# Patient Record
Sex: Male | Born: 1965 | Race: White | Hispanic: No | Smoking: Current every day smoker
Health system: Southern US, Community
[De-identification: ages and names within clinical notes are randomized; demographics above are authoritative.]

## PROBLEM LIST (undated history)

## (undated) HISTORY — PX: KNEE SURGERY: SHX244

## (undated) HISTORY — PX: TONSILLECTOMY: SUR1361

---

## 2019-05-11 ENCOUNTER — Other Ambulatory Visit: Payer: Self-pay

## 2019-05-11 ENCOUNTER — Encounter (HOSPITAL_BASED_OUTPATIENT_CLINIC_OR_DEPARTMENT_OTHER): Payer: Self-pay | Admitting: *Deleted

## 2019-05-11 ENCOUNTER — Emergency Department (HOSPITAL_BASED_OUTPATIENT_CLINIC_OR_DEPARTMENT_OTHER)
Admission: EM | Admit: 2019-05-11 | Discharge: 2019-05-11 | Disposition: A | Discharge status: Home / Self Care | Payer: BLUE CROSS/BLUE SHIELD | Attending: Emergency Medicine | Admitting: Emergency Medicine

## 2019-05-11 ENCOUNTER — Emergency Department (HOSPITAL_BASED_OUTPATIENT_CLINIC_OR_DEPARTMENT_OTHER): Payer: BLUE CROSS/BLUE SHIELD

## 2019-05-11 DIAGNOSIS — Z23 Encounter for immunization: Secondary | ICD-10-CM | POA: Insufficient documentation

## 2019-05-11 DIAGNOSIS — W25XXXA Contact with sharp glass, initial encounter: Secondary | ICD-10-CM | POA: Insufficient documentation

## 2019-05-11 DIAGNOSIS — Y929 Unspecified place or not applicable: Secondary | ICD-10-CM | POA: Insufficient documentation

## 2019-05-11 DIAGNOSIS — Y9389 Activity, other specified: Secondary | ICD-10-CM | POA: Insufficient documentation

## 2019-05-11 DIAGNOSIS — Z79899 Other long term (current) drug therapy: Secondary | ICD-10-CM | POA: Insufficient documentation

## 2019-05-11 DIAGNOSIS — Y998 Other external cause status: Secondary | ICD-10-CM | POA: Diagnosis not present

## 2019-05-11 DIAGNOSIS — S61217A Laceration without foreign body of left little finger without damage to nail, initial encounter: Secondary | ICD-10-CM | POA: Insufficient documentation

## 2019-05-11 DIAGNOSIS — F17200 Nicotine dependence, unspecified, uncomplicated: Secondary | ICD-10-CM | POA: Diagnosis not present

## 2019-05-11 DIAGNOSIS — S6992XA Unspecified injury of left wrist, hand and finger(s), initial encounter: Secondary | ICD-10-CM | POA: Diagnosis present

## 2019-05-11 MED ORDER — ACETAMINOPHEN 325 MG PO TABS
650.0000 mg | ORAL_TABLET | Freq: Once | ORAL | Status: AC
  Administered 2019-05-11: 650 mg via ORAL
  Filled 2019-05-11: qty 2

## 2019-05-11 MED ORDER — TETANUS-DIPHTH-ACELL PERTUSSIS 5-2.5-18.5 LF-MCG/0.5 IM SUSP
0.5000 mL | Freq: Once | INTRAMUSCULAR | Status: AC
  Administered 2019-05-11: 0.5 mL via INTRAMUSCULAR
  Filled 2019-05-11: qty 0.5

## 2019-05-11 MED ORDER — ACETAMINOPHEN 325 MG PO TABS
ORAL_TABLET | ORAL | Status: AC
  Filled 2019-05-11: qty 1

## 2019-05-11 MED ORDER — LIDOCAINE HCL (PF) 1 % IJ SOLN
5.0000 mL | Freq: Once | INTRAMUSCULAR | Status: AC
  Administered 2019-05-11: 5 mL
  Filled 2019-05-11: qty 5

## 2019-05-11 MED ORDER — OXYCODONE HCL 5 MG PO TABS
5.0000 mg | ORAL_TABLET | Freq: Once | ORAL | Status: AC
  Administered 2019-05-11: 5 mg via ORAL
  Filled 2019-05-11: qty 1

## 2019-05-11 MED ORDER — DOXYCYCLINE HYCLATE 100 MG PO CAPS: 100.0000 mg | ORAL_CAPSULE | Freq: Two times a day (BID) | ORAL | 0 refills | Status: AC

## 2019-05-11 NOTE — ED Notes (Signed)
Pt. Finger has been numbed

## 2019-05-11 NOTE — ED Provider Notes (Signed)
[facility] EMERGENCY DEPARTMENT Provider Note   CSN: [id number] Arrival date & time: 05/11/19  1359     History Chief Complaint  Patient presents with  . Skin Avulsion    William Bolton is a 54 y.o. male otherwise healthy no daily medication use patient carrying a mirror shortly before arrival when it broke lacerating his left fifth finger, patient immediately controlled bleeding with direct pressure.  Describes a moderate intensity sharp pain constant nonradiating worsened with palpation improved with rest and time.  He denies any other concerns today, no headache, nausea/vomiting, numbness/tingling, weakness, or additional injuries. HPI     History reviewed. No pertinent past medical history.  There are no problems to display for this patient.   Past Surgical History:  Procedure Laterality Date  . KNEE SURGERY    . TONSILLECTOMY         No family history on file.  Social History   Tobacco Use  . Smoking status: Current Every Day Smoker  . Smokeless tobacco: Never Used  Substance Use Topics  . Alcohol use: Yes  . Drug use: Never    Home Medications Prior to Admission medications   Medication Sig Start Date End Date Taking? Authorizing Provider  omeprazole (PRILOSEC) 40 MG capsule Take by mouth. 12/20/17 01/31/20 Yes [provider]  doxycycline (VIBRAMYCIN) 100 MG capsule Take 1 capsule (100 mg total) by mouth 2 (two) times daily for 7 days. 05/11/19 05/18/19  Harlene Salts A, PA-C    Allergies    Amaranth (fd&c red #2), Iodine, Iodine-131, Codeine, and Hydrocodone  Review of Systems   Review of Systems  Constitutional: Negative.  Negative for chills and fever.  Gastrointestinal: Negative.  Negative for nausea and vomiting.  Skin: Positive for wound. Negative for color change.  Neurological: Negative.  Negative for weakness and numbness.    Physical Exam Updated Vital Signs BP (!) 152/97 (BP Location: Right Arm)   Pulse 69   Temp  98.1 F (36.7 C) (Oral)   Resp 16   Ht 6\' 2"  (1.88 m)   Wt 78.9 kg   SpO2 99%   BMI 22.34 kg/m   Physical Exam Constitutional:      General: He is not in acute distress.    Appearance: Normal appearance. He is well-developed. He is not ill-appearing or diaphoretic.  HENT:     Head: Normocephalic and atraumatic.     Right Ear: External ear normal.     Left Ear: External ear normal.     Nose: Nose normal.  Eyes:     General: Vision grossly intact. Gaze aligned appropriately.     Pupils: Pupils are equal, round, and reactive to light.  Neck:     Trachea: Trachea and phonation normal. No tracheal deviation.  Pulmonary:     Effort: Pulmonary effort is normal. No respiratory distress.  Abdominal:     General: There is no distension.     Palpations: Abdomen is soft.     Tenderness: There is no abdominal tenderness. There is no guarding or rebound.  Musculoskeletal:        General: Normal range of motion.       Hands:     Cervical back: Normal range of motion.     Comments: Left hand: 1 cm laceration extending from the ulnar side just below the nailbed distally to the tip of the finger.  No gross deformities.  Fingernail appears intact.  Otherwise fingers appear normal.  Tenderness directly over laceration.  No other pain. No snuffbox tenderness to palpation. No tenderness to palpation over flexor sheath. Finger adduction/abduction intact with 5/5 strength.  Thumb opposition intact. Full active and resisted ROM to flexion/extension at wrist, MCP, PIP and DIP of all fingers.  FDS/FDP intact. Grip 5/5 strength. Radial artery 2+ with <2sec cap refill in all fingers. Sensation intact to light-tough in median/ulnar/radial distributions. - See picture below, picture was taken before cleaning.  After the wound was thoroughly cleaned the small scab which is present at the distal tip along with the small amount of blood on the ulnar side of the finger was removed, these small areas were holding  the wound together revealing the laceration.  Skin:    General: Skin is warm and dry.  Neurological:     Mental Status: He is alert.     GCS: GCS eye subscore is 4. GCS verbal subscore is 5. GCS motor subscore is 6.     Comments: Speech is clear and goal oriented, follows commands Major Cranial nerves without deficit, no facial droop Moves extremities without ataxia, coordination intact  Psychiatric:        Behavior: Behavior normal.         ED Results / Procedures / Treatments   Labs (all labs ordered are listed, but only abnormal results are displayed) Labs Reviewed - No data to display  EKG None  Radiology DG Finger Little Left  Result Date: 05/11/2019 CLINICAL DATA:  Laceration to left little finger on broken [place]. EXAM: LEFT LITTLE FINGER 2+V COMPARISON:  None. FINDINGS: There is no evidence of fracture or dislocation. There is no evidence of arthropathy or other focal bone abnormality. The site of laceration is not well delineated radiographically. No radiopaque foreign body. IMPRESSION: No acute osseous abnormality. No radiopaque foreign body. Site of laceration is not well delineated radiographically. Electronically Signed   By: Keith Rake M.D.   On: 05/11/2019 15:38    Procedures .Marland KitchenLaceration Repair  Date/Time: 05/11/2019 4:05 PM Performed by: Deliah Boston, PA-C Authorized by: Deliah Boston, PA-C   Consent:    Consent obtained:  Verbal   Consent given by:  Patient   Risks discussed:  Infection, need for additional repair, nerve damage, poor wound healing, poor cosmetic result, pain, retained foreign body, tendon damage and vascular damage Anesthesia (see MAR for exact dosages):    Anesthesia method:  Nerve block   Block location:  Based on left   Block needle gauge:  27 G   Block anesthetic:  Lidocaine 1% w/o epi   Block technique:  Additional   Block injection procedure:  Anatomic landmarks identified, introduced needle, negative aspiration  for blood, incremental injection and anatomic landmarks palpated   Block outcome:  Anesthesia achieved Laceration details:    Location:  Finger   Finger location:  L small finger   Length (cm):  1   Depth (mm):  3 Repair type:    Repair type:  Simple Pre-procedure details:    Preparation:  Patient was prepped and draped in usual sterile fashion and imaging obtained to evaluate for foreign bodies Exploration:    Hemostasis achieved with:  Direct pressure   Wound exploration: wound explored through full range of motion and entire depth of wound probed and visualized     Wound extent: no foreign bodies/material noted, no muscle damage noted, no nerve damage noted, no tendon damage noted, no underlying fracture noted and no vascular damage noted   Treatment:    Area cleansed  with:  Shur-Clens, saline and soap and water   Amount of cleaning:  Standard   Irrigation solution:  Sterile saline   Irrigation method:  Pressure wash Skin repair:    Repair method:  Sutures   Suture size:  6-0   Suture material:  Prolene   Suture technique:  Simple interrupted   Number of sutures:  2 Approximation:    Approximation:  Close Post-procedure details:    Dressing:  Antibiotic ointment, non-adherent dressing and sterile dressing   Patient tolerance of procedure:  Tolerated well, no immediate complications Comments:     Dressing by nursing staff.   (including critical care time)  Medications Ordered in ED Medications  lidocaine (PF) (XYLOCAINE) 1 % injection 5 mL (5 mLs Infiltration Given 05/11/19 1504)  Tdap (BOOSTRIX) injection 0.5 mL (0.5 mLs Intramuscular Given 05/11/19 1455)  acetaminophen (TYLENOL) tablet 650 mg (650 mg Oral Given 05/11/19 1455)  oxyCODONE (Oxy IR/ROXICODONE) immediate release tablet 5 mg (5 mg Oral Given 05/11/19 1607)    ED Course  I have reviewed the triage vital signs and the nursing notes.  Pertinent labs & imaging results that were available during my care of the  patient were reviewed by me and considered in my medical decision making (see chart for details).  Clinical Course as of May 10 1629  Thu May 11, 2019  1537 Patient seen and evaluated by Dr. Renaye Rakers who agrees with workup and suture repair.   [BM]    Clinical Course User Index [BM] Elizabeth Palau   MDM Rules/Calculators/A&P                     Kristan Votta is a 54 y.o. male who presents to ED for laceration of the left fifth finger.  Laceration extends from the ulnar aspect just below the nail across to remove the distal tip.  Laceration is relatively shallow with no evidence of osseous injury, no significant vessel involvement.  He is neurovascular intact to the finger and nailbed is stable.  The laceration was thoroughly cleaned in the ED.    DG Left Little Finger: MPRESSION: No acute osseous abnormality. No radiopaque foreign body. Site of laceration is not well delineated radiographically.  I personally reviewed patient's x-ray and agree with radiologist interpretation, no evidence of foreign body or osseous injury today.  Patient's tetanus was updated. Wound thoroughly cleaned in ED today. Wound explored and bottom of wound seen in a bloodless field. Laceration repaired as dictated above.  Dr. Renaye Rakers visualized post repair, agrees with discharge at this time, advises to discharge patient with antibiotic as needed, doxy 100 mg twice daily x7 days prescribed.  Additionally patient given referral to Dr. Merrilee Seashore office as needed for follow-up.  Patient counseled on home wound care. Follow up with PCP/urgent care or return to ER for suture removal in 7 days days.  Patient was given 1 oxycodone prior to discharge, he has a ride to drive him home today.  Discussed narcotic precautions with patient and he states understanding.  At this time there does not appear to be any evidence of an acute emergency medical condition and the patient appears stable for discharge with appropriate  outpatient follow up. Diagnosis was discussed with patient who verbalizes understanding of care plan and is agreeable to discharge. I have discussed return precautions with patient who verbalizes understanding. Patient encouraged to follow-up with their PCP and Hand. All questions answered.  Patient seen and evaluated by Dr. Renaye Rakers during  this visit who agrees with discharge and outpatient follow-up.  Note: Portions of this report may have been transcribed using voice recognition software. Every effort was made to ensure accuracy; however, inadvertent computerized transcription errors may still be present. Final Clinical Impression(s) / ED Diagnoses Final diagnoses:  Laceration of left little finger without foreign body without damage to nail, initial encounter    Rx / DC Orders ED Discharge Orders         Ordered    doxycycline (VIBRAMYCIN) 100 MG capsule  2 times daily     05/11/19 1627           Elizabeth Palau 05/11/19 1633    Terald Sleeper, MD 05/11/19 1752

## 2019-05-11 NOTE — Discharge Instructions (Signed)
You have been diagnosed today with Left Fifth Finger Laceration.  At this time there does not appear to be the presence of an emergent medical condition, however there is always the potential for conditions to change. Please read and follow the below instructions.  Please return to the Emergency Department immediately for any new or worsening symptoms. Please be sure to follow up with your Primary Care Provider within one week regarding your visit today; please call their office to schedule an appointment even if you are feeling better for a follow-up visit. You are given a pain medication today called oxycodone.  This will make you drowsy.  Do not drink alcohol, drive or perform any dangerous activities for the rest of the day. Your 2 stitches will need to be removed in 7 days.  They may be removed by your primary care doctor, and urgent care, the ER or by a hand specialist. You have been prescribed an antibiotic called Keflex today, you may take this as prescribed if you feel you are developing signs of a finger infection.  Signs of infection include redness, swelling, drainage, pain, redness streaking up the finger, difficulty moving the joint, fever. Please be sure to keep the area clean.  You may rinse gently with clean soapy water twice daily and bandaged with sterile bandages.  Get help right away if: You have very bad swelling around the wound. Your pain suddenly gets worse and is very bad. You notice painful lumps near the wound or anywhere on your body. You have a red streak going away from your wound. The wound is on your hand or foot, and: You cannot move a finger or toe. Your fingers or toes look pale or bluish. You have fever or chills You have any new/concerning or worsening of symptoms  Please read the additional information packets attached to your discharge summary.  Do not take your medicine if  develop an itchy rash, swelling in your mouth or lips, or difficulty breathing;  call 911 and seek immediate emergency medical attention if this occurs.  Note: Portions of this text may have been transcribed using voice recognition software. Every effort was made to ensure accuracy; however, inadvertent computerized transcription errors may still be present.

## 2019-05-11 NOTE — ED Notes (Signed)
Pt. Reports a mirror broke at apprx. 12:40 today and sliced his pinky finger on the  L hand.  Pt. Has noted bandage on it to control bleeding.  Pt. Reports the bandage is controlling the bleeding. RN to leave bandage placed from Triage due to bleeding.

## 2019-05-11 NOTE — ED Triage Notes (Signed)
Laceration to his left little finger on a broken mirror.

## 2021-06-12 IMAGING — CR DG FINGER LITTLE 2+V*L*
3 series · 3 of 3 positions shown · non-contrast
Comparison: None.

CLINICAL DATA: Laceration to left little finger on broken [place].

EXAM:
LEFT LITTLE FINGER 2+V

[x finger pa left]
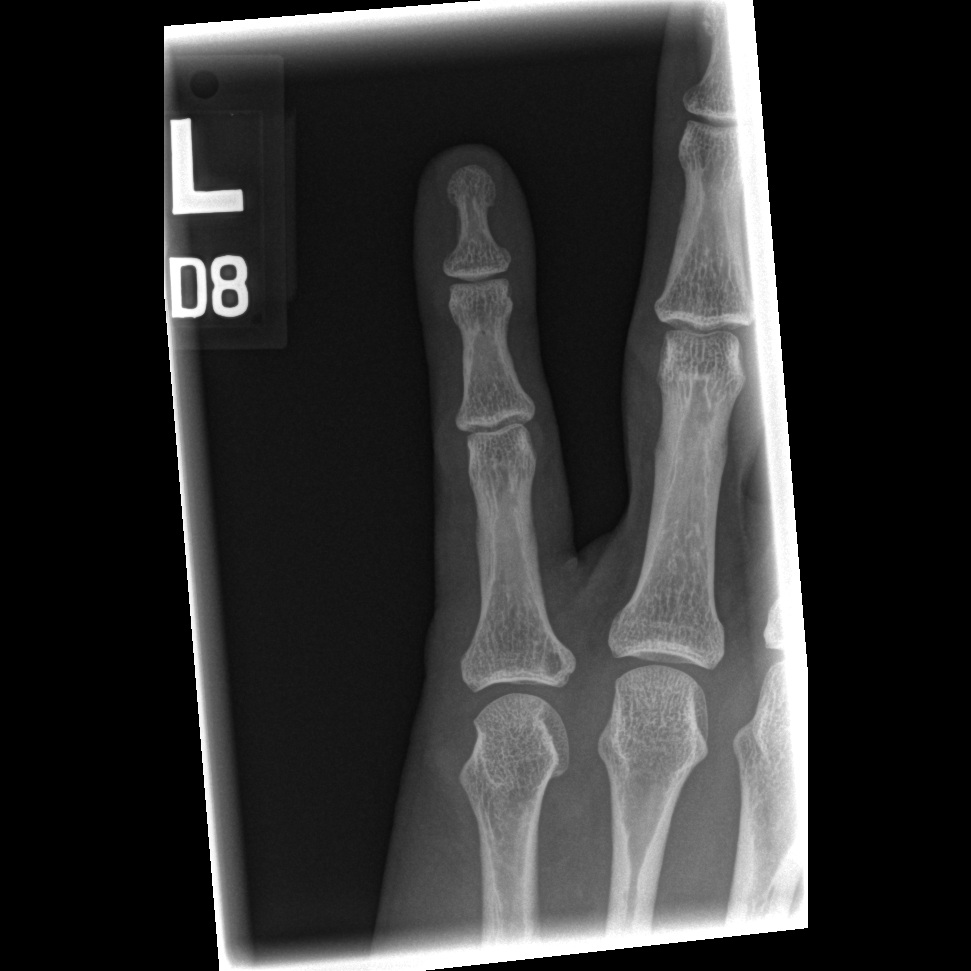

[x finger obl. left]
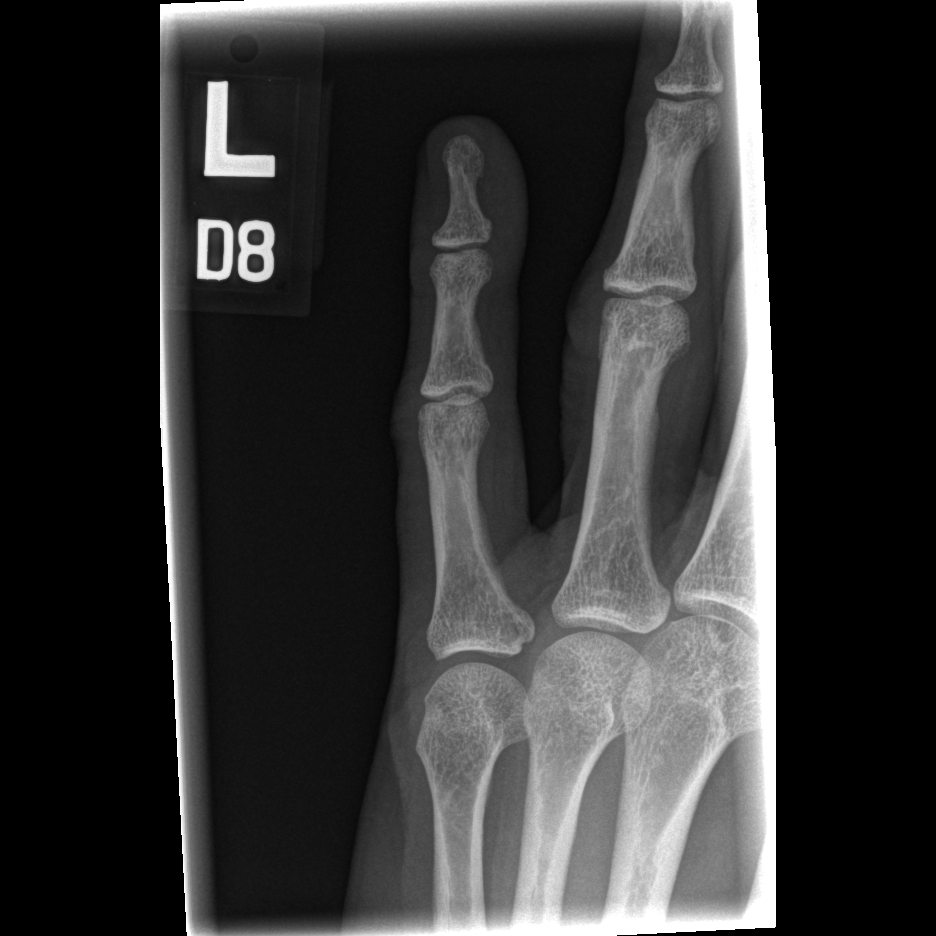

[x finger lateral left]
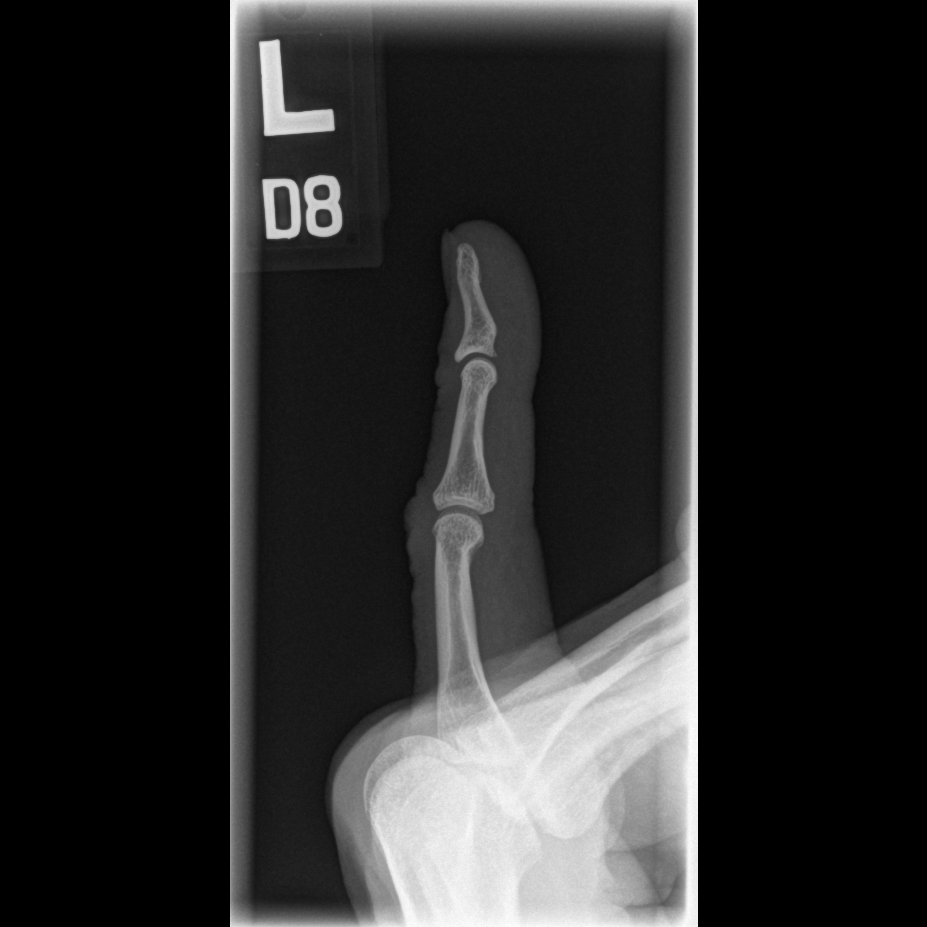

[3 of 3 positions shown; findings below may reference images not displayed]

FINDINGS: There is no evidence of fracture or dislocation. There is no
evidence of arthropathy or other focal bone abnormality. The site of
laceration is not well delineated radiographically. No radiopaque
foreign body.
IMPRESSION: No acute osseous abnormality. No radiopaque foreign body. Site of
laceration is not well delineated radiographically.
